# Patient Record
Sex: Male | Born: 1945 | Race: Black or African American | Hispanic: No | Marital: Married | Smoking: Never smoker
Health system: Southern US, Community
[De-identification: ages and names within clinical notes are randomized; demographics above are authoritative.]

## PROBLEM LIST (undated history)

## (undated) DIAGNOSIS — N400 Enlarged prostate without lower urinary tract symptoms: Secondary | ICD-10-CM

---

## 2014-12-11 ENCOUNTER — Encounter (HOSPITAL_COMMUNITY): Payer: Self-pay | Admitting: Emergency Medicine

## 2014-12-11 ENCOUNTER — Emergency Department (HOSPITAL_COMMUNITY): Payer: Medicare Other

## 2014-12-11 ENCOUNTER — Emergency Department (HOSPITAL_COMMUNITY)
Admission: EM | Admit: 2014-12-11 | Discharge: 2014-12-11 | Disposition: A | Discharge status: Home / Self Care | Payer: Medicare Other | Attending: Emergency Medicine | Admitting: Emergency Medicine

## 2014-12-11 DIAGNOSIS — T148XXA Other injury of unspecified body region, initial encounter: Secondary | ICD-10-CM

## 2014-12-11 DIAGNOSIS — S0592XA Unspecified injury of left eye and orbit, initial encounter: Secondary | ICD-10-CM | POA: Insufficient documentation

## 2014-12-11 DIAGNOSIS — Y9389 Activity, other specified: Secondary | ICD-10-CM | POA: Diagnosis not present

## 2014-12-11 DIAGNOSIS — Y998 Other external cause status: Secondary | ICD-10-CM | POA: Insufficient documentation

## 2014-12-11 DIAGNOSIS — S0990XA Unspecified injury of head, initial encounter: Secondary | ICD-10-CM

## 2014-12-11 DIAGNOSIS — Z87448 Personal history of other diseases of urinary system: Secondary | ICD-10-CM | POA: Insufficient documentation

## 2014-12-11 DIAGNOSIS — W19XXXA Unspecified fall, initial encounter: Secondary | ICD-10-CM

## 2014-12-11 DIAGNOSIS — Y92524 Gas station as the place of occurrence of the external cause: Secondary | ICD-10-CM | POA: Diagnosis not present

## 2014-12-11 DIAGNOSIS — S0181XA Laceration without foreign body of other part of head, initial encounter: Secondary | ICD-10-CM | POA: Insufficient documentation

## 2014-12-11 DIAGNOSIS — W010XXA Fall on same level from slipping, tripping and stumbling without subsequent striking against object, initial encounter: Secondary | ICD-10-CM | POA: Insufficient documentation

## 2014-12-11 HISTORY — DX: Benign prostatic hyperplasia without lower urinary tract symptoms: N40.0

## 2014-12-11 MED ORDER — TOBRAMYCIN 0.3 % OP OINT
TOPICAL_OINTMENT | Freq: Two times a day (BID) | OPHTHALMIC | Status: DC
  Administered 2014-12-11: 17:00:00 via OPHTHALMIC
  Filled 2014-12-11: qty 3.5

## 2014-12-11 MED ORDER — HYDROCODONE-ACETAMINOPHEN 5-325 MG PO TABS: 2.0000 | ORAL_TABLET | ORAL | Status: AC | PRN

## 2014-12-11 NOTE — ED Provider Notes (Signed)
CSN: [id number]     Arrival date & time 12/11/14  1317 History   First MD Initiated Contact with Patient 12/11/14 1329     Chief Complaint  Patient presents with  . Fall     (Consider location/radiation/quality/duration/timing/severity/associated sxs/prior Treatment) Patient is a 69 y.o. male presenting with fall. The history is provided by the patient and the EMS personnel.  Fall Associated symptoms include headaches. Pertinent negatives include no chest pain, no abdominal pain and no shortness of breath.   patient status post fall at a gas station tripped and fell getting out of the car quickly landing on his face predominantly left-sided head and face. Patient denies loss of consciousness feeling dizzy or lightheaded. Patient without any neck pain or back pain or arm or leg pain. Patient did not have a loss of consciousness he did not pass out there was no syncope. Patient states his tetanus is up-to-date.  Past Medical History  Diagnosis Date  . BPH (benign prostatic hyperplasia)    History reviewed. No pertinent past surgical history. History reviewed. No pertinent family history. History  Substance Use Topics  . Smoking status: Never Smoker   . Smokeless tobacco: Not on file  . Alcohol Use: No    Review of Systems  Constitutional: Negative for fever.  HENT: Negative for congestion, nosebleeds and trouble swallowing.   Eyes: Negative for visual disturbance.  Respiratory: Negative for shortness of breath.   Cardiovascular: Negative for chest pain.  Gastrointestinal: Negative for abdominal pain.  Genitourinary: Negative for dysuria.  Musculoskeletal: Negative for back pain and neck pain.  Skin: Positive for wound. Negative for rash.  Neurological: Positive for headaches. Negative for syncope, weakness and numbness.  Hematological: Does not bruise/bleed easily.  Psychiatric/Behavioral: Negative for confusion.      Allergies  Review of patient's allergies indicates no  known allergies.  Home Medications   Prior to Admission medications   Not on File   BP 161/102 mmHg  Pulse 80  Temp(Src) 98.4 F (36.9 C) (Oral)  Resp 18  SpO2 97% Physical Exam  Constitutional: He is oriented to person, place, and time. He appears well-developed and well-nourished. No distress.  HENT:  Mouth/Throat: Oropharynx is clear and moist.  Patient with an abrasion stellate laceration to left for head. Significant abrasion under her left eye. Marked swelling to left upper eyelid. Linear laceration of left face above left lip that is closed does not communicate intraorally. No intraoral trauma.  Eyes: EOM are normal. Pupils are equal, round, and reactive to light.  Neck: Normal range of motion. Neck supple.  Cardiovascular: Normal rate, regular rhythm and normal heart sounds.   No murmur heard. Pulmonary/Chest: Effort normal and breath sounds normal. No respiratory distress.  Abdominal: Soft. Bowel sounds are normal. There is no tenderness.  Musculoskeletal: Normal range of motion. He exhibits no tenderness.  Neurological: He is alert and oriented to person, place, and time. No cranial nerve deficit. He exhibits normal muscle tone. Coordination normal.  Skin: Skin is warm. No rash noted.  Nursing note and vitals reviewed.   ED Course  Procedures (including critical care time) Labs Review Labs Reviewed - No data to display  Imaging Review No results found.   EKG Interpretation None      MDM   Final diagnoses:  Fall  Head injury, initial encounter  Abrasion  Laceration of forehead, initial encounter  Laceration of face, initial encounter    Patient status post fall tripped getting out of the car  at a gas station when he had to go to the bathroom. No loss of consciousness no syncope. [place] of head neck and face ordered.  Patient with abrasion stellate laceration to the left for head area patient with an abrasion under  the left eye marked swelling of the left upper eyelid. Eye normal underneath. Patient with a linear laceration above the left upper lip that is closed no suturing required. No oral injuries. No other specific injuries.  CT results still pending we'll turn over to evening physician.  Vanetta MuldersScott Nalayah Hitt, MD 12/11/14 [phone]

## 2014-12-11 NOTE — ED Notes (Signed)
Per [facility], pt comes from [place] stopped at gas station and pt tripped and fell on left side of head. Pt has a hematoma left side of head, with a 1 inch horizontal laceration. Pt has an abrasion to left cheek and left upper lip. Pt denies LOC or dizziness/lightheadness or back pain. Pt c/o right neck pain from fall 6/10, C-collar in place. Pt has h/o BPH, no allergies. VSS: BP 140/70, P86, O2 96% rm air.

## 2014-12-11 NOTE — ED Notes (Signed)
Patient transported to CT 

## 2014-12-11 NOTE — ED Provider Notes (Addendum)
Patient is alert Glasgow Coma Score 15 and relates without difficulty not lightheaded on standing. He has abrasions at forehead and left periorbital area. Left eye is swollen shut. CT results reviewed by me. Plan tobramycin ointment to left periorbital area prescription Norco. Cold therapy Follow-up PMD if significant pain or swelling not improved by next week. Blood pressure recheck 3 weeks No results found for this or any previous visit. Ct Head Wo Contrast  12/11/2014   CLINICAL DATA:  Fall.  Left facial/orbital soft tissue swelling.  EXAM: CT HEAD WITHOUT CONTRAST  CT MAXILLOFACIAL WITHOUT CONTRAST  CT CERVICAL SPINE WITHOUT CONTRAST  TECHNIQUE: Multidetector CT imaging of the head, cervical spine, and maxillofacial structures were performed using the standard protocol without intravenous contrast. Multiplanar CT image reconstructions of the cervical spine and maxillofacial structures were also generated.  COMPARISON:  None.  FINDINGS: CT HEAD FINDINGS  Soft tissue swelling over the left forehead and orbit. No underlying calvarial fracture. No acute intracranial abnormality. Specifically, no hemorrhage, hydrocephalus, mass lesion, acute infarction, or significant intracranial injury. No acute calvarial abnormality.  CT MAXILLOFACIAL FINDINGS  Soft tissue swelling over the left orbit and forehead. No orbital or facial fracture. Zygomatic arches and mandible are intact.  Mucosal thickening in the floors of the maxillary sinuses bilaterally. No air-fluid levels.  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 No fracture or malalignment. Prevertebral soft tissues are normal. No epidural or paraspinal hematoma.  IMPRESSION: No acute intracranial abnormality.  No facial, orbital, or cervical spine fracture.   Electronically Signed   By: Charlett NoseKevin  Dover M.D.   On: 12/11/2014 16:16   Ct Cervical Spine Wo Contrast  12/11/2014   CLINICAL DATA:   Fall.  Left facial/orbital soft tissue swelling.  EXAM: CT HEAD WITHOUT CONTRAST  CT MAXILLOFACIAL WITHOUT CONTRAST  CT CERVICAL SPINE WITHOUT CONTRAST  TECHNIQUE: Multidetector CT imaging of the head, cervical spine, and maxillofacial structures were performed using the standard protocol without intravenous contrast. Multiplanar CT image reconstructions of the cervical spine and maxillofacial structures were also generated.  COMPARISON:  None.  FINDINGS: CT HEAD FINDINGS  Soft tissue swelling over the left forehead and orbit. No underlying calvarial fracture. No acute intracranial abnormality. Specifically, no hemorrhage, hydrocephalus, mass lesion, acute infarction, or significant intracranial injury. No acute calvarial abnormality.  CT MAXILLOFACIAL FINDINGS  Soft tissue swelling over the left orbit and forehead. No orbital or facial fracture. Zygomatic arches and mandible are intact.  Mucosal thickening in the floors of the maxillary sinuses bilaterally. No air-fluid levels.  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 No fracture or malalignment. Prevertebral soft tissues are normal. No epidural or paraspinal hematoma.  IMPRESSION: No acute intracranial abnormality.  No facial, orbital, or cervical spine fracture.   Electronically Signed   By: Charlett NoseKevin  Dover M.D.   On: 12/11/2014 16:16   Ct Maxillofacial Wo Cm  12/11/2014   CLINICAL DATA:  Fall.  Left facial/orbital soft tissue swelling.  EXAM: CT HEAD WITHOUT CONTRAST  CT MAXILLOFACIAL WITHOUT CONTRAST  CT CERVICAL SPINE WITHOUT CONTRAST  TECHNIQUE: Multidetector CT imaging of the head, cervical spine, and maxillofacial structures were performed using the standard protocol without intravenous contrast. Multiplanar CT image reconstructions of the cervical spine and maxillofacial structures were also generated.  COMPARISON:  None.  FINDINGS: CT HEAD FINDINGS  Soft tissue swelling  over the left forehead and orbit.  No underlying calvarial fracture. No acute intracranial abnormality. Specifically, no hemorrhage, hydrocephalus, mass lesion, acute infarction, or significant intracranial injury. No acute calvarial abnormality.  CT MAXILLOFACIAL FINDINGS  Soft tissue swelling over the left orbit and forehead. No orbital or facial fracture. Zygomatic arches and mandible are intact.  Mucosal thickening in the floors of the maxillary sinuses bilaterally. No air-fluid levels.  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 No fracture or malalignment. Prevertebral soft tissues are normal. No epidural or paraspinal hematoma.  IMPRESSION: No acute intracranial abnormality.  No facial, orbital, or cervical spine fracture.   Electronically Signed   By: Charlett Nose M.D.   On: 12/11/2014 16:16     Doug Sou, MD 12/11/14 1710  Doug Sou, MD 12/11/14 1710  Doug Sou, MD 12/11/14 1715

## 2014-12-11 NOTE — Discharge Instructions (Signed)
Wash wounds twice daily with soap and water and then place a thin layer of the antibiotic ointment on the abrasions on your face twice daily after washing. Take Tylenol for mild pain or the pain medicine prescribed for bad pain. Hold an ice pack over your the swollen part of your face 4 times daily for 30 minutes at a time to help with swelling. See your primary care physician if significant pain or swelling by next week. Get your blood pressure recheck within the next 3 weeks. Today's was mildly elevated at 155/94

## 2015-09-28 IMAGING — CT CT HEAD W/O CM
4 of 8 series · 17 of 47 positions shown, 19 images · non-contrast
Comparison: None.

CLINICAL DATA: Fall.  Left facial/orbital soft tissue swelling.

EXAM:
CT HEAD WITHOUT CONTRAST
CT MAXILLOFACIAL WITHOUT CONTRAST
CT CERVICAL SPINE WITHOUT CONTRAST
TECHNIQUE: Multidetector CT imaging of the head, cervical spine, and
maxillofacial structures were performed using the standard protocol
without intravenous contrast. Multiplanar CT image reconstructions
of the cervical spine and maxillofacial structures were also
generated.

[Series 4: facial/ orbits 2.0 h30s · axial · 0.42mm/px · z∈[-244,-106]mm · 7 of 93 slices shown, 9 images]
[im 12/93  brain]
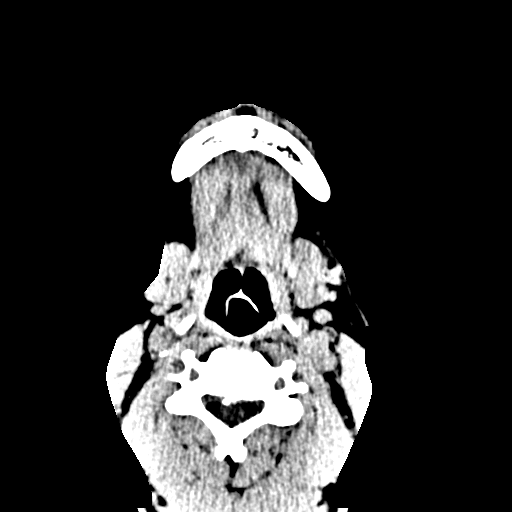
[im 12/93  bone]
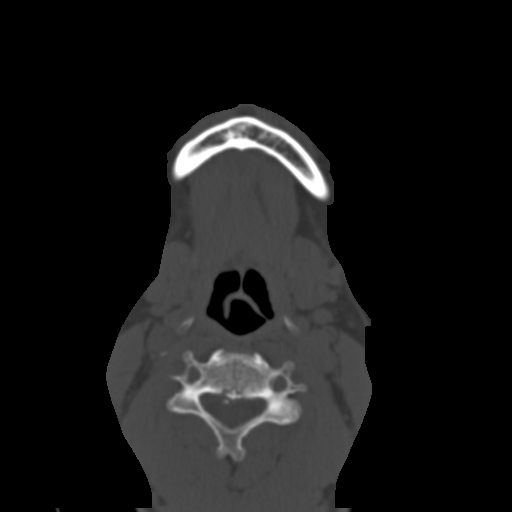
[im 24/93  brain]
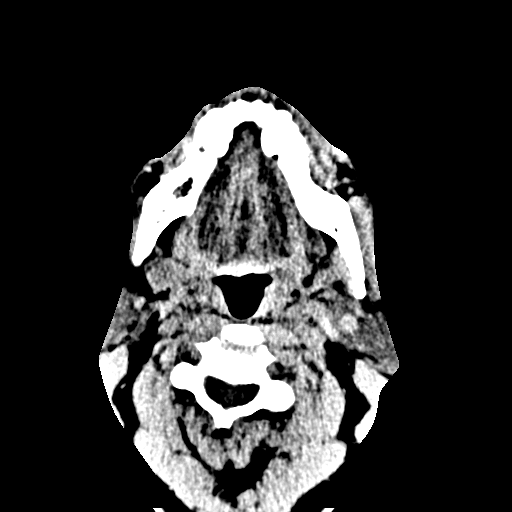
[im 35/93  brain]
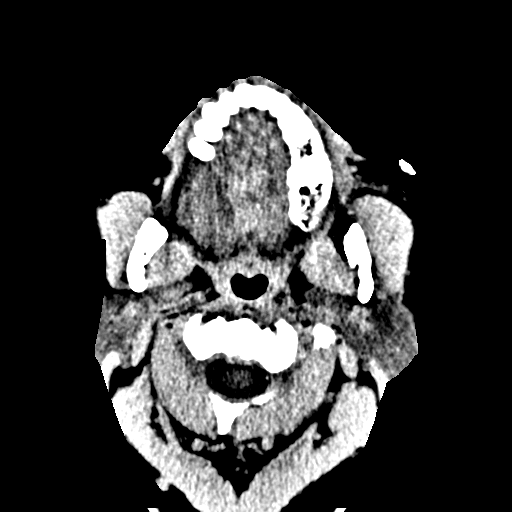
[im 47/93  brain]
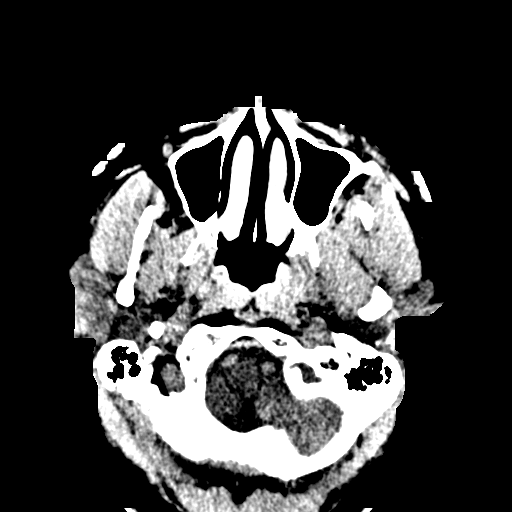
[im 58/93  brain]
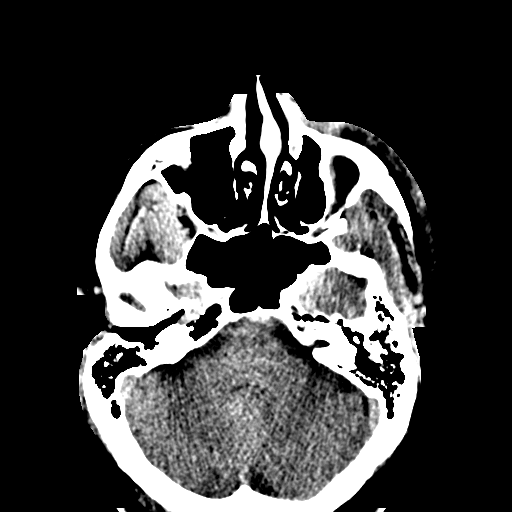
[im 58/93  bone]
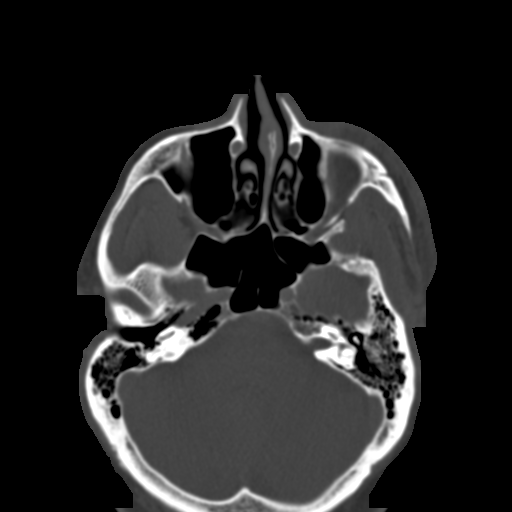
[im 70/93  brain]
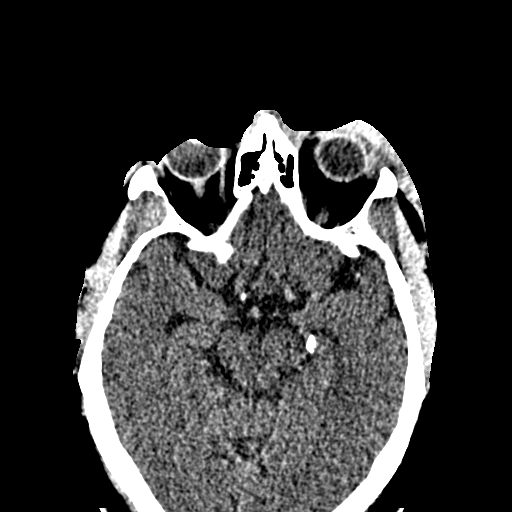
[im 81/93  brain]
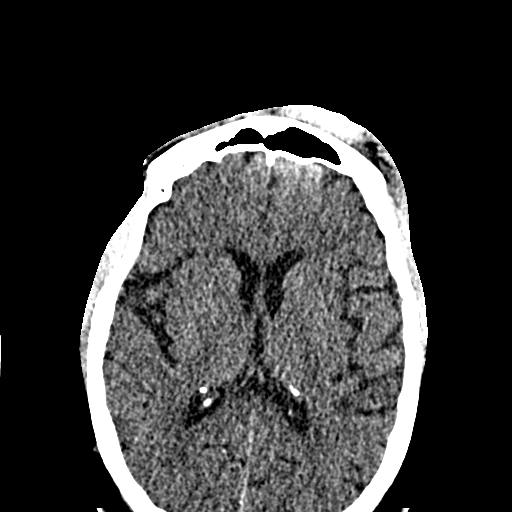

[Series 9: c_spine 2.0 b30s · axial · 0.25mm/px · z∈[-318,-250]mm · 4 of 92 slices shown]
[im 12/92  brain]
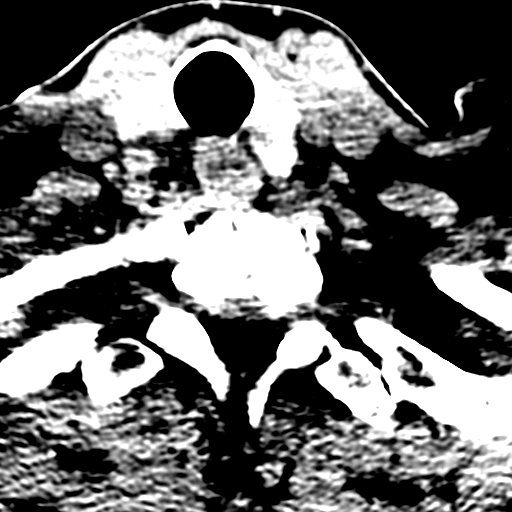
[im 23/92  brain]
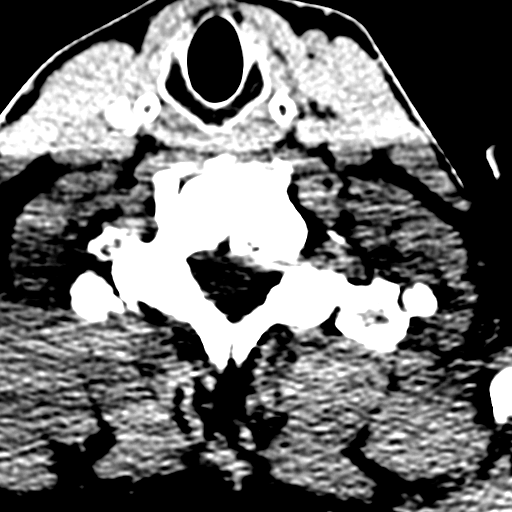
[im 35/92  brain]
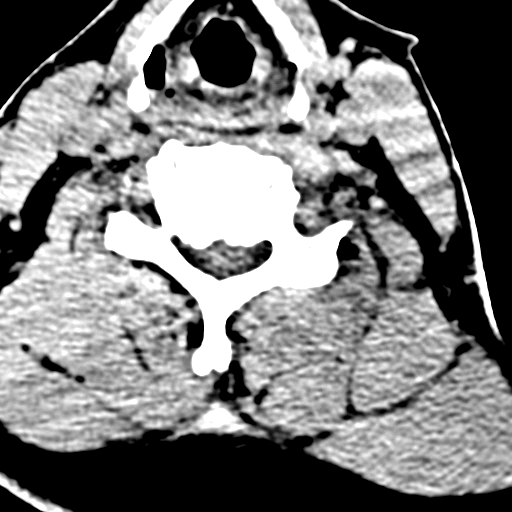
[im 46/92  brain]
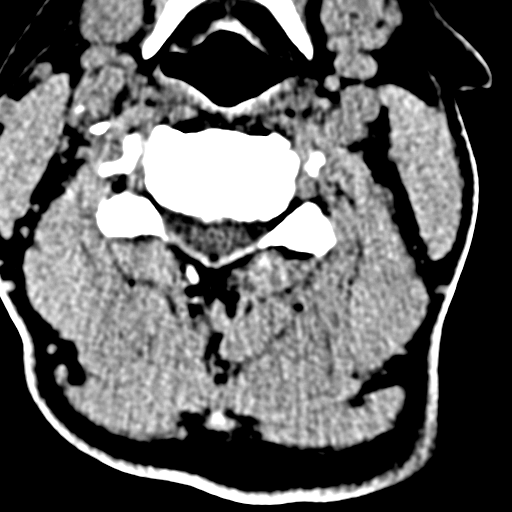

[Series 602: coronal soft · coronal · 0.42mm/px · 3 of 86 slices shown]
[im 25/86  brain]
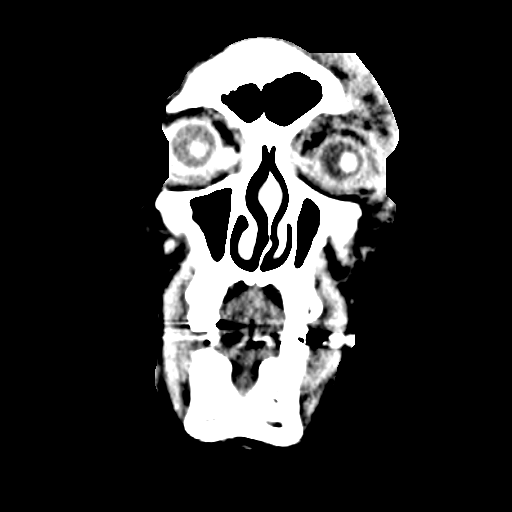
[im 37/86  brain]
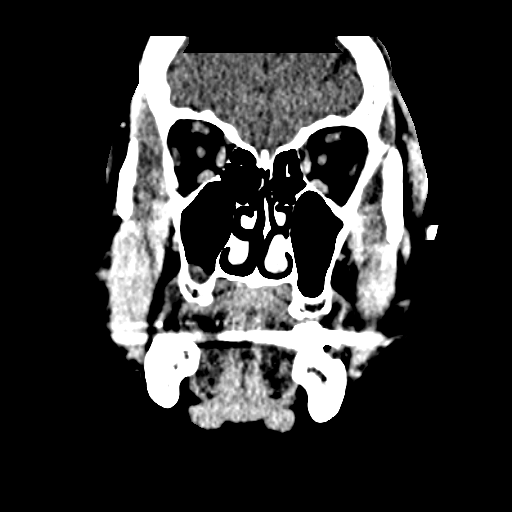
[im 49/86  brain]
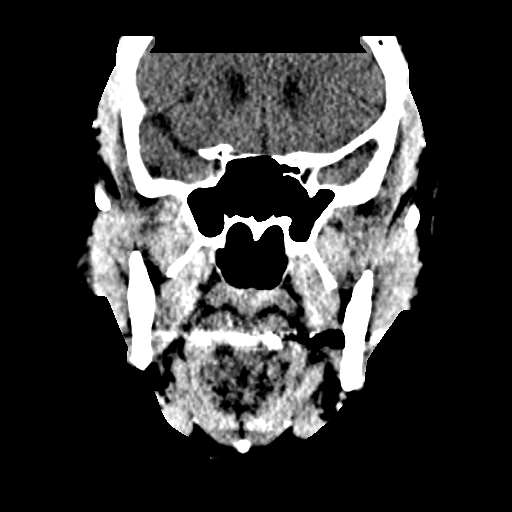

[Series 603: sagital soft · sagittal · 0.42mm/px · 3 of 86 slices shown]
[im 22/86  brain]
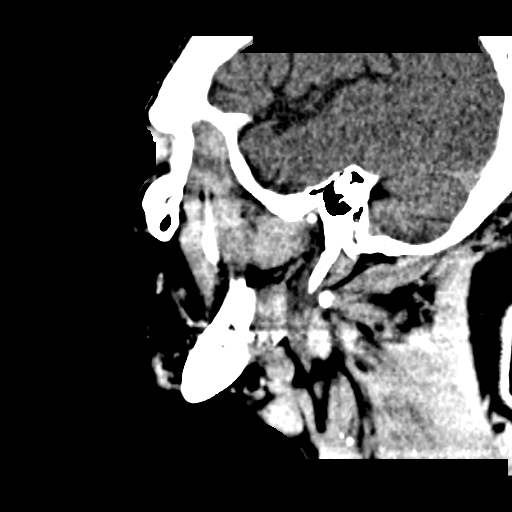
[im 43/86  brain]
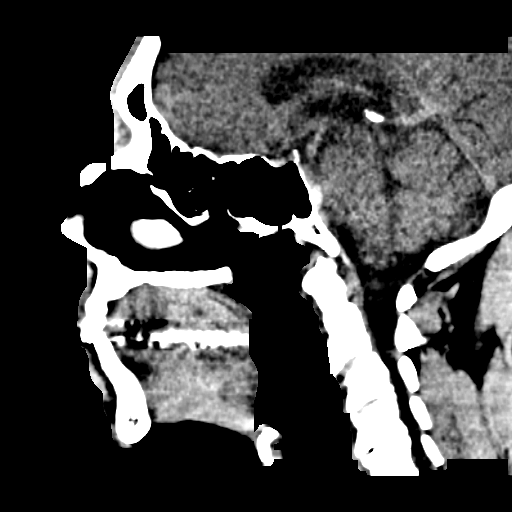
[im 64/86  brain]
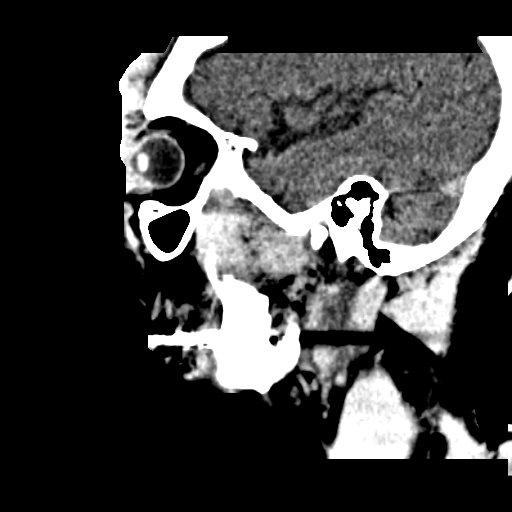

[17 of 47 positions shown; findings below may reference images not displayed]

FINDINGS: CT HEAD FINDINGS

Soft tissue swelling over the left forehead and orbit. No underlying
calvarial fracture. No acute intracranial abnormality. Specifically,
no hemorrhage, hydrocephalus, mass lesion, acute infarction, or
significant intracranial injury. No acute calvarial abnormality.

CT MAXILLOFACIAL FINDINGS

Soft tissue swelling over the left orbit and forehead. No orbital or
facial fracture. Zygomatic arches and mandible are intact.

Mucosal thickening in the floors of the maxillary sinuses
bilaterally. No air-fluid levels.

CT CERVICAL SPINE FINDINGS

Degenerative disc disease throughout the cervical spine, most
pronounced from C4-5 thru C6-7. No fracture or malalignment.
Prevertebral soft tissues are normal. No epidural or paraspinal
hematoma.
IMPRESSION: No acute intracranial abnormality.

No facial, orbital, or cervical spine fracture.
